# Patient Record
Sex: Female | Born: 1985 | Hispanic: No | Marital: Single | Smoking: Never smoker
Health system: Southern US, Community
[De-identification: ages and names within clinical notes are randomized; demographics above are authoritative.]

## PROBLEM LIST (undated history)

## (undated) DIAGNOSIS — D239 Other benign neoplasm of skin, unspecified: Secondary | ICD-10-CM

## (undated) HISTORY — DX: Other benign neoplasm of skin, unspecified: D23.9

---

## 2018-05-14 ENCOUNTER — Emergency Department
Admission: EM | Admit: 2018-05-14 | Discharge: 2018-05-14 | Disposition: A | Discharge status: Home / Self Care | Payer: Self-pay | Attending: Emergency Medicine | Admitting: Emergency Medicine

## 2018-05-14 ENCOUNTER — Encounter: Payer: Self-pay | Admitting: Emergency Medicine

## 2018-05-14 ENCOUNTER — Emergency Department: Payer: Self-pay

## 2018-05-14 ENCOUNTER — Other Ambulatory Visit: Payer: Self-pay

## 2018-05-14 DIAGNOSIS — Z79899 Other long term (current) drug therapy: Secondary | ICD-10-CM | POA: Insufficient documentation

## 2018-05-14 DIAGNOSIS — R0789 Other chest pain: Secondary | ICD-10-CM | POA: Insufficient documentation

## 2018-05-14 LAB — COMPREHENSIVE METABOLIC PANEL
ALK PHOS: 67 U/L (ref 38–126)
ALT: 16 U/L (ref 0–44)
ANION GAP: 8 (ref 5–15)
AST: 20 U/L (ref 15–41)
Albumin: 4 g/dL (ref 3.5–5.0)
BILIRUBIN TOTAL: 0.3 mg/dL (ref 0.3–1.2)
BUN: 13 mg/dL (ref 6–20)
CALCIUM: 9.2 mg/dL (ref 8.9–10.3)
CO2: 25 mmol/L (ref 22–32)
Chloride: 106 mmol/L (ref 98–111)
Creatinine, Ser: 0.61 mg/dL (ref 0.44–1.00)
GFR calc Af Amer: >60 mL/min (ref >60)
GFR calc non Af Amer: >60 mL/min (ref >60)
Glucose, Bld: 119 mg/dL — ABNORMAL HIGH (ref 70–99)
Potassium: 4.6 mmol/L (ref 3.5–5.1)
SODIUM: 139 mmol/L (ref 135–145)
Total Protein: 8 g/dL (ref 6.5–8.1)

## 2018-05-14 LAB — CBC
HCT: 37 % (ref 35.0–47.0)
Hemoglobin: 12.8 g/dL (ref 12.0–16.0)
MCH: 29.9 pg (ref 26.0–34.0)
MCHC: 34.5 g/dL (ref 32.0–36.0)
MCV: 86.5 fL (ref 80.0–100.0)
PLATELETS: 332 10*3/uL (ref 150–440)
RBC: 4.28 MIL/uL (ref 3.80–5.20)
RDW: 13.2 % (ref 11.5–14.5)
WBC: 6.3 10*3/uL (ref 3.6–11.0)

## 2018-05-14 LAB — TROPONIN I: Troponin I: <0.03 ng/mL (ref <0.03)

## 2018-05-14 LAB — FIBRIN DERIVATIVES D-DIMER (ARMC ONLY): Fibrin derivatives D-dimer (ARMC): 128.38 ng/mL (FEU) (ref 0.00–499.00)

## 2018-05-14 MED ORDER — NAPROXEN 500 MG PO TABS: 500.0000 mg | ORAL_TABLET | Freq: Two times a day (BID) | ORAL | 2 refills | Status: DC

## 2018-05-14 NOTE — ED Notes (Signed)
First Nurse Note: Patient complaining of chest pain radiating to her back on inspiration.  EKG done and patient complaint reviewed with Dr.Kinner.

## 2018-05-14 NOTE — ED Provider Notes (Signed)
[facility] Emergency Department Provider Note   ____________________________________________    I have reviewed the triage vital signs and the nursing notes.   HISTORY  Chief Complaint Chest Pain and Back Pain     HPI Carol Juarez is a 32 y.o. female resents with chest pain.  Patient reports when she takes deep breath she feels pain along the right lateral border of her sternum.  Symptoms started yesterday.  She does not take anything for this.  Denies significant shortness of breath.  No fevers or chills or cough.  No recent travel.  No calf pain or swelling.  No history of blood clots.  Does report recent URI  History reviewed. No pertinent past medical history.  There are no active problems to display for this patient.   History reviewed. No pertinent surgical history.  Prior to Admission medications   Medication Sig Start Date End Date Taking? Authorizing Provider  Omega-3 Fatty Acids (FISH OIL) 1000 MG CAPS Take 1,000 mg by mouth daily.   Yes [provider]  naproxen (NAPROSYN) 500 MG tablet Take 1 tablet (500 mg total) by mouth 2 (two) times daily with a meal. 05/14/18   Lavonia Drafts, MD     Allergies Patient has no known allergies.  No family history on file.  Social History Social History   Tobacco Use  . Smoking status: Not on file  Substance Use Topics  . Alcohol use: Not on file  . Drug use: Not on file    Review of Systems  Constitutional: No fever/chills Eyes: No visual changes.  ENT: No sore throat. Cardiovascular: As above Respiratory: As above Gastrointestinal: No abdominal pain.  No nausea, no vomiting.   Genitourinary: Negative for dysuria. Musculoskeletal: Negative for back pain. Skin: Negative for rash. Neurological: Negative for headaches or weakness   ____________________________________________   PHYSICAL EXAM:  VITAL SIGNS: ED Triage Vitals  Enc Vitals Group     BP 05/14/18 0834  136/86     Pulse Rate 05/14/18 0834 62     Resp 05/14/18 0834 20     Temp 05/14/18 0834 98.2 F (36.8 C)     Temp Source 05/14/18 0834 Oral     SpO2 05/14/18 0834 99 %     Weight 05/14/18 0835 84.4 kg (186 lb)     Height 05/14/18 0835 1.651 m (5\' 5" )     Head Circumference --      Peak Flow --      Pain Score 05/14/18 0835 5     Pain Loc --      Pain Edu? --      Excl. in [place]? --     Constitutional: Alert and oriented. No acute distress.   Nose: No congestion/rhinnorhea. Mouth/Throat: Mucous membranes are moist.   Neck:  Painless ROM Cardiovascular: Normal rate, regular rhythm. Grossly normal heart sounds.  Good peripheral circulation.  Chest wall: Tenderness palpation over the right lateral sternal border Respiratory: Normal respiratory effort.  No retractions. Lungs CTAB. Gastrointestinal: Soft and nontender. No distention.    Musculoskeletal: No lower extremity tenderness nor edema.  Warm and well perfused Neurologic:  Normal speech and language. No gross focal neurologic deficits are appreciated.  Skin:  Skin is warm, dry and intact. No rash noted. Psychiatric: Mood and affect are normal. Speech and behavior are normal.  ____________________________________________   LABS (all labs ordered are listed, but only abnormal results are displayed)  Labs Reviewed  COMPREHENSIVE METABOLIC PANEL - Abnormal;  Notable for the following components:      Result Value   Glucose, Bld 119 (*)    All other components within normal limits  FIBRIN DERIVATIVES D-DIMER ([facility] ONLY)  TROPONIN I  CBC   ____________________________________________  EKG  ED ECG REPORT I, Lavonia Drafts, the attending physician, personally viewed and interpreted this ECG.  Date: 05/14/2018  Rhythm: normal sinus rhythm QRS Axis: normal Intervals: normal ST/T Wave abnormalities: normal Narrative Interpretation: no evidence of acute  ischemia  ____________________________________________  RADIOLOGY  Chest x-ray normal ____________________________________________   PROCEDURES  Procedure(s) performed: No  Procedures   Critical Care performed: No ____________________________________________   INITIAL IMPRESSION / ASSESSMENT AND PLAN / ED COURSE  Pertinent labs & imaging results that were available during my care of the patient were reviewed by me and considered in my medical decision making (see chart for details).  Patient well-appearing in no acute distress.  Low risk for PE, d-dimer is negative as well.  Labs unremarkable, EKG normal, not consistent with ACS.  Suspect costochondritis given tenderness over the right lateral sternal border, will treat with NSAIDs, outpatient follow-up with PCP.  Return precautions discussed    ____________________________________________   FINAL CLINICAL IMPRESSION(S) / ED DIAGNOSES  Final diagnoses:  Chest wall pain        Note:  This document was prepared using Dragon voice recognition software and may include unintentional dictation errors.    Lavonia Drafts, MD 05/14/18 [phone]

## 2018-05-14 NOTE — ED Triage Notes (Signed)
Pt reports yesterday started with a stabbing chest pain to the center of her chest that radiates through to her back. Pt reports when she inhales she feels like she can't get a good breath and the pain comes and.

## 2018-05-14 NOTE — ED Notes (Signed)
Patient returns from Radiology

## 2019-03-28 ENCOUNTER — Other Ambulatory Visit: Payer: Self-pay

## 2019-03-28 DIAGNOSIS — Z20822 Contact with and (suspected) exposure to covid-19: Secondary | ICD-10-CM

## 2019-03-29 LAB — NOVEL CORONAVIRUS, NAA: SARS-CoV-2, NAA: NOT DETECTED

## 2019-06-22 ENCOUNTER — Encounter (HOSPITAL_COMMUNITY): Payer: Self-pay | Admitting: Emergency Medicine

## 2019-06-22 ENCOUNTER — Other Ambulatory Visit: Payer: Self-pay

## 2019-06-22 ENCOUNTER — Emergency Department (HOSPITAL_COMMUNITY): Payer: Medicaid Other

## 2019-06-22 ENCOUNTER — Emergency Department (HOSPITAL_COMMUNITY)
Admission: EM | Admit: 2019-06-22 | Discharge: 2019-06-22 | Disposition: A | Discharge status: Home / Self Care | Payer: Medicaid Other | Attending: Emergency Medicine | Admitting: Emergency Medicine

## 2019-06-22 DIAGNOSIS — Y9389 Activity, other specified: Secondary | ICD-10-CM | POA: Insufficient documentation

## 2019-06-22 DIAGNOSIS — Y998 Other external cause status: Secondary | ICD-10-CM | POA: Insufficient documentation

## 2019-06-22 DIAGNOSIS — S46911A Strain of unspecified muscle, fascia and tendon at shoulder and upper arm level, right arm, initial encounter: Secondary | ICD-10-CM | POA: Diagnosis not present

## 2019-06-22 DIAGNOSIS — Y92410 Unspecified street and highway as the place of occurrence of the external cause: Secondary | ICD-10-CM | POA: Diagnosis not present

## 2019-06-22 DIAGNOSIS — Z79899 Other long term (current) drug therapy: Secondary | ICD-10-CM | POA: Diagnosis not present

## 2019-06-22 DIAGNOSIS — S4991XA Unspecified injury of right shoulder and upper arm, initial encounter: Secondary | ICD-10-CM | POA: Diagnosis present

## 2019-06-22 MED ORDER — CYCLOBENZAPRINE HCL 10 MG PO TABS: 10.0000 mg | ORAL_TABLET | Freq: Two times a day (BID) | ORAL | 0 refills | Status: DC | PRN
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
## 2019-06-22 NOTE — Discharge Instructions (Addendum)
Please read instructions below. Your xray is normal. Apply ice to your areas of pain for 20 minutes at a time. You can take 600 mg of Advil/ibuprofen every 6 hours as needed for pain. You can take flexeril every 12 hours as needed for muscle spasm. Be aware this medication can make you drowsy. Schedule an appointment with your primary care provider to follow up on your visit today. Return to the ER for severely worsening headache, vision changes, if new numbness or tingling in your arms or legs, inability to urinate, inability to hold your bowels, or weakness in your extremities.
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
## 2019-07-10 ENCOUNTER — Emergency Department
Admission: EM | Admit: 2019-07-10 | Discharge: 2019-07-10 | Disposition: A | Discharge status: Home / Self Care | Payer: Medicaid Other | Attending: Emergency Medicine | Admitting: Emergency Medicine

## 2019-07-10 ENCOUNTER — Other Ambulatory Visit: Payer: Self-pay

## 2019-07-10 ENCOUNTER — Emergency Department: Payer: Medicaid Other

## 2019-07-10 ENCOUNTER — Encounter: Payer: Self-pay | Admitting: Emergency Medicine

## 2019-07-10 DIAGNOSIS — Y92009 Unspecified place in unspecified non-institutional (private) residence as the place of occurrence of the external cause: Secondary | ICD-10-CM | POA: Diagnosis not present

## 2019-07-10 DIAGNOSIS — Y939 Activity, unspecified: Secondary | ICD-10-CM | POA: Diagnosis not present

## 2019-07-10 DIAGNOSIS — S52501A Unspecified fracture of the lower end of right radius, initial encounter for closed fracture: Secondary | ICD-10-CM | POA: Diagnosis not present

## 2019-07-10 DIAGNOSIS — Z79899 Other long term (current) drug therapy: Secondary | ICD-10-CM | POA: Diagnosis not present

## 2019-07-10 DIAGNOSIS — Y999 Unspecified external cause status: Secondary | ICD-10-CM | POA: Diagnosis not present

## 2019-07-10 DIAGNOSIS — W010XXA Fall on same level from slipping, tripping and stumbling without subsequent striking against object, initial encounter: Secondary | ICD-10-CM | POA: Insufficient documentation

## 2019-07-10 DIAGNOSIS — S6991XA Unspecified injury of right wrist, hand and finger(s), initial encounter: Secondary | ICD-10-CM | POA: Diagnosis present

## 2019-07-10 MED ORDER — OXYCODONE-ACETAMINOPHEN 5-325 MG PO TABS
1.0000 | ORAL_TABLET | Freq: Once | ORAL | Status: AC
  Administered 2019-07-10: 1 via ORAL
  Filled 2019-07-10: qty 1

## 2019-07-10 MED ORDER — OXYCODONE-ACETAMINOPHEN 5-325 MG PO TABS: 1.0000 | ORAL_TABLET | ORAL | 0 refills | Status: DC | PRN

## 2019-07-10 NOTE — ED Triage Notes (Signed)
Pt to ED from home c/o right wrist pain tonight after a fall.  States pain to lateral side and pointer finger.  Skin WNL and palpable radial pulse.
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
## 2019-08-10 ENCOUNTER — Encounter: Payer: Medicaid Other | Admitting: Obstetrics and Gynecology

## 2019-08-18 ENCOUNTER — Encounter: Payer: Medicaid Other | Admitting: Obstetrics and Gynecology

## 2019-09-01 ENCOUNTER — Encounter: Payer: Medicaid Other | Admitting: Obstetrics and Gynecology

## 2019-12-08 ENCOUNTER — Ambulatory Visit: Payer: Medicaid Other | Attending: Internal Medicine

## 2019-12-08 DIAGNOSIS — Z20822 Contact with and (suspected) exposure to covid-19: Secondary | ICD-10-CM

## 2019-12-09 ENCOUNTER — Telehealth: Payer: Self-pay | Admitting: *Deleted

## 2019-12-09 LAB — SARS-COV-2, NAA 2 DAY TAT

## 2019-12-09 LAB — NOVEL CORONAVIRUS, NAA: SARS-CoV-2, NAA: NOT DETECTED

## 2019-12-09 NOTE — Telephone Encounter (Signed)
Patient called given negative covid results . 

## 2020-02-12 ENCOUNTER — Emergency Department
Admission: EM | Admit: 2020-02-12 | Discharge: 2020-02-12 | Disposition: A | Discharge status: Home / Self Care | Payer: Medicaid Other | Attending: Emergency Medicine | Admitting: Emergency Medicine

## 2020-02-12 ENCOUNTER — Other Ambulatory Visit: Payer: Self-pay

## 2020-02-12 DIAGNOSIS — L989 Disorder of the skin and subcutaneous tissue, unspecified: Secondary | ICD-10-CM | POA: Diagnosis not present

## 2020-02-12 DIAGNOSIS — L0291 Cutaneous abscess, unspecified: Secondary | ICD-10-CM | POA: Diagnosis present

## 2020-02-12 MED ORDER — LIDOCAINE 2 % EX GEL: 1.0000 "application " | Freq: Three times a day (TID) | CUTANEOUS | 1 refills | Status: DC | PRN

## 2020-02-12 NOTE — ED Triage Notes (Signed)
Pt arrived via POV with c/o bump to head, that appears to be a skin tag or keloid. Pt unsure what it is but has been painful for the past several days. States it started out small but has increased in size over the past few days.

## 2020-02-12 NOTE — ED Provider Notes (Signed)
[facility] Emergency Department Provider Note  ____________________________________________  Time seen: Approximately 8:44 PM  I have reviewed the triage vital signs and the nursing notes.   HISTORY  Chief Complaint Abscess    HPI Carol Juarez is a 34 y.o. female who presents the emergency department with a growing lesion to the left scalp.  Patient states that she had a skin tag-like lesion to the left scalp that has been there for years.  Patient states that it has rapidly enlarged over the past few weeks and has become very painful.  Patient denies any other symptoms or complaints.  No medications for his complaint prior to arrival         No past medical history on file.  There are no problems to display for this patient.   Past Surgical History:  Procedure Laterality Date  . CESAREAN SECTION      Prior to Admission medications   Medication Sig Start Date End Date Taking? Authorizing Provider  cyclobenzaprine (FLEXERIL) 10 MG tablet Take 1 tablet (10 mg total) by mouth 2 (two) times daily as needed for muscle spasms. 06/22/19   Robinson, [place] N, PA-C  Lidocaine 2 % GEL Apply 1 application topically 3 (three) times daily as needed. 02/12/20   Esthela Brandner, Charline Bills, PA-C  naproxen (NAPROSYN) 500 MG tablet Take 1 tablet (500 mg total) by mouth 2 (two) times daily with a meal. 05/14/18   Lavonia Drafts, MD  Omega-3 Fatty Acids (FISH OIL) 1000 MG CAPS Take 1,000 mg by mouth daily.    [provider]  oxyCODONE-acetaminophen (PERCOCET) 5-325 MG tablet Take 1 tablet by mouth every 4 (four) hours as needed for severe pain. 07/10/19 07/09/20  Nance Pear, MD    Allergies Patient has no known allergies.  No family history on file.  Social History Social History   Tobacco Use  . Smoking status: Never Smoker  . Smokeless tobacco: Never Used  Substance Use Topics  . Alcohol use: Never  . Drug use: Never     Review of Systems   Constitutional: No fever/chills Eyes: No visual changes. No discharge ENT: No upper respiratory complaints. Cardiovascular: no chest pain. Respiratory: no cough. No SOB. Gastrointestinal: No abdominal pain.  No nausea, no vomiting.  No diarrhea.  No constipation. Musculoskeletal: Negative for musculoskeletal pain. Skin: Growing skin lesion to the left scalp Neurological: Negative for headaches, focal weakness or numbness. 10-point ROS otherwise negative.  ____________________________________________   PHYSICAL EXAM:  VITAL SIGNS: ED Triage Vitals  Enc Vitals Group     BP 02/12/20 1952 (!) 141/80     Pulse Rate 02/12/20 1952 64     Resp 02/12/20 1952 18     Temp 02/12/20 1952 98.8 F (37.1 C)     Temp Source 02/12/20 1952 Oral     SpO2 02/12/20 1952 97 %     Weight 02/12/20 1951 184 lb (83.5 kg)     Height 02/12/20 1951 5\' 5"  (1.651 m)     Head Circumference --      Peak Flow --      Pain Score 02/12/20 1951 5     Pain Loc --      Pain Edu? --      Excl. in [place]? --      Constitutional: Alert and oriented. Well appearing and in no acute distress. Eyes: Conjunctivae are normal. PERRL. EOMI. Head: Atraumatic.  Patient with an enlarged skin lesion to the left scalp.  This is raised,  pedunculated.  No gross erythema.  Area is exquisitely tender to palpation.  There does appear to be vasculature involved in the lesion though this does not appear to be hemangioma.  No fluctuance.  Area is very firm and tender.  No surrounding skin changes. ENT:      Ears:       Nose: No congestion/rhinnorhea.      Mouth/Throat: Mucous membranes are moist.  Neck: No stridor.   Hematological/Lymphatic/Immunilogical: No cervical lymphadenopathy. Cardiovascular: Normal rate, regular rhythm. Normal S1 and S2.  Good peripheral circulation. Respiratory: Normal respiratory effort without tachypnea or retractions. Lungs CTAB. Good air entry to the bases with no decreased or absent breath  sounds. Musculoskeletal: Full range of motion to all extremities. No gross deformities appreciated. Neurologic:  Normal speech and language. No gross focal neurologic deficits are appreciated.  Skin:  Skin is warm, dry and intact. No rash noted.  See above head note Psychiatric: Mood and affect are normal. Speech and behavior are normal. Patient exhibits appropriate insight and judgement.   ____________________________________________   LABS (all labs ordered are listed, but only abnormal results are displayed)  Labs Reviewed - No data to display ____________________________________________  EKG   ____________________________________________  RADIOLOGY   No results found.  ____________________________________________    PROCEDURES  Procedure(s) performed:    Procedures    Medications - No data to display   ____________________________________________   INITIAL IMPRESSION / ASSESSMENT AND PLAN / ED COURSE  Pertinent labs & imaging results that were available during my care of the patient were reviewed by me and considered in my medical decision making (see chart for details).  Review of the  [place]RS was performed in accordance of the [place] prior to dispensing any controlled drugs.           Patient's diagnosis is consistent with skin lesion to the scalp.  Patient presented to the emergency department with a rapidly enlarging lesion to the left scalp.  She states that she had a skin tag to the left scalp that has rapidly enlarged over the past several weeks.  It is very painful for the patient.  Area was very tender to palpation.  There does appear to be vasculature involved in this lesion however does not appear to be a hemangioma.  Differential included skin cancer, hemangioma, nevus, acrochordon.  As I am unsure the exact etiology, no attempts at removal was performed.  I recommended that the patient follow-up with dermatology for further evaluation and  management.  Topical lidocaine provided for symptom relief until patient can see dermatology. Patient is given ED precautions to return to the ED for any worsening or new symptoms.     ____________________________________________  FINAL CLINICAL IMPRESSION(S) / ED DIAGNOSES  Final diagnoses:  Skin lesion of scalp      NEW MEDICATIONS STARTED DURING THIS VISIT:  ED Discharge Orders         Ordered    Lidocaine 2 % GEL  3 times daily PRN     Discontinue  Reprint     02/12/20 2144              This chart was dictated using voice recognition software/Dragon. Despite best efforts to proofread, errors can occur which can change the meaning. Any change was purely unintentional.    Darletta Moll, PA-C 02/13/20 0013    Blake Divine, MD 02/13/20 [phone]

## 2020-06-07 ENCOUNTER — Emergency Department
Admission: EM | Admit: 2020-06-07 | Discharge: 2020-06-07 | Disposition: A | Discharge status: Home / Self Care | Payer: Medicaid Other | Attending: Emergency Medicine | Admitting: Emergency Medicine

## 2020-06-07 ENCOUNTER — Other Ambulatory Visit: Payer: Self-pay

## 2020-06-07 ENCOUNTER — Emergency Department: Payer: Medicaid Other

## 2020-06-07 DIAGNOSIS — R509 Fever, unspecified: Secondary | ICD-10-CM | POA: Diagnosis present

## 2020-06-07 DIAGNOSIS — U071 COVID-19: Secondary | ICD-10-CM | POA: Diagnosis not present

## 2020-06-07 LAB — RESPIRATORY PANEL BY RT PCR (FLU A&B, COVID)
Influenza A by PCR: NEGATIVE
Influenza B by PCR: NEGATIVE
SARS Coronavirus 2 by RT PCR: POSITIVE — AB

## 2020-06-07 MED ORDER — BENZONATATE 100 MG PO CAPS: ORAL_CAPSULE | ORAL | 0 refills | Status: DC

## 2020-06-07 MED ORDER — ACETAMINOPHEN 325 MG PO TABS
650.0000 mg | ORAL_TABLET | Freq: Once | ORAL | Status: AC
  Administered 2020-06-07: 650 mg via ORAL
  Filled 2020-06-07: qty 2

## 2020-06-07 MED ORDER — PREDNISONE 20 MG PO TABS: ORAL_TABLET | ORAL | 0 refills | Status: DC

## 2020-06-07 MED ORDER — METOCLOPRAMIDE HCL 10 MG PO TABS
10.0000 mg | ORAL_TABLET | Freq: Once | ORAL | Status: AC
  Administered 2020-06-07: 10 mg via ORAL
  Filled 2020-06-07: qty 1

## 2020-06-07 MED ORDER — ALBUTEROL SULFATE HFA 108 (90 BASE) MCG/ACT IN AERS: 2.0000 | INHALATION_SPRAY | Freq: Four times a day (QID) | RESPIRATORY_TRACT | 0 refills | Status: DC | PRN

## 2020-06-07 MED ORDER — ONDANSETRON 4 MG PO TBDP: 4.0000 mg | ORAL_TABLET | Freq: Three times a day (TID) | ORAL | 0 refills | Status: DC | PRN

## 2020-06-07 MED ORDER — IBUPROFEN 800 MG PO TABS
800.0000 mg | ORAL_TABLET | Freq: Once | ORAL | Status: AC
  Administered 2020-06-07: 800 mg via ORAL
  Filled 2020-06-07: qty 1

## 2020-06-07 NOTE — ED Provider Notes (Signed)
[facility] Emergency Department Provider Note ____________________________________________  Time seen: 2051  I have reviewed the triage vital signs and the nursing notes.  HISTORY  Chief Complaint  Fever, Generalized Body Aches, and Sore Throat   HPI Carol Juarez is a 34 y.o. female presents himself to the ED for evaluation of symptoms including  sore throat, nasal congestion, cough, fever, chills, body aches.  Patient describes her son was exposed to Covid over a week ago but they both tested negative for Covid last week.  She reports onset of her symptoms a few days earlier.  She still reports the ability to taste and smell food as expected.  She has not recently been vaccinated for Covid.  She denies any other significant medical history.  No past medical history on file.  There are no problems to display for this patient.   Past Surgical History:  Procedure Laterality Date  . CESAREAN SECTION      Prior to Admission medications   Medication Sig Start Date End Date Taking? Authorizing Provider  albuterol (VENTOLIN HFA) 108 (90 Base) MCG/ACT inhaler Inhale 2 puffs into the lungs every 6 (six) hours as needed for shortness of breath. 06/07/20   Delila Kuklinski, Dannielle Karvonen, PA-C  benzonatate (TESSALON PERLES) 100 MG capsule Take 1-2 tabs TID prn cough 06/07/20   Lida Berkery, Dannielle Karvonen, PA-C  Omega-3 Fatty Acids (FISH OIL) 1000 MG CAPS Take 1,000 mg by mouth daily.    [provider]  ondansetron (ZOFRAN ODT) 4 MG disintegrating tablet Take 1 tablet (4 mg total) by mouth every 8 (eight) hours as needed. 06/07/20   Marica Trentham, Dannielle Karvonen, PA-C  predniSONE (DELTASONE) 20 MG tablet Take 3 tabs daily x 3 days; Take 2 tabs daily x 3 days; Take 1 tab daily x 3 days; Take 0.5 tabs daily x 4 days 06/07/20   Radek Carnero, Dannielle Karvonen, PA-C    Allergies Patient has no known allergies.  No family history on file.  Social History Social History   Tobacco  Use  . Smoking status: Never Smoker  . Smokeless tobacco: Never Used  Substance Use Topics  . Alcohol use: Never  . Drug use: Never    Review of Systems  Constitutional: Positive for fever, body aches, and malaise.. Eyes: Negative for visual changes. ENT: Positive for sore throat. Cardiovascular: Negative for chest pain. Respiratory: Positive for shortness of breath and cough as above. Gastrointestinal: Negative for abdominal pain, vomiting and diarrhea. Genitourinary: Negative for dysuria. Musculoskeletal: Negative for back pain. Skin: Negative for rash. Neurological: Negative for headaches, focal weakness or numbness. ____________________________________________  PHYSICAL EXAM:  VITAL SIGNS: ED Triage Vitals  Enc Vitals Group     BP 06/07/20 1946 (!) 155/99     Pulse Rate 06/07/20 1946 91     Resp 06/07/20 1946 18     Temp 06/07/20 1946 (!) 101.4 F (38.6 C)     Temp Source 06/07/20 1946 Oral     SpO2 06/07/20 1946 100 %     Weight 06/07/20 1947 178 lb (80.7 kg)     Height 06/07/20 1947 5\' 5"  (1.651 m)     Head Circumference --      Peak Flow --      Pain Score 06/07/20 1947 7     Pain Loc --      Pain Edu? --      Excl. in [place]? --     Constitutional: Alert and oriented. Well appearing and  in no distress. Head: Normocephalic and atraumatic. Eyes: Conjunctivae are normal. PERRL. Normal extraocular movements Ears: Canals clear. TMs intact bilaterally. Nose: No congestion/rhinorrhea/epistaxis. Mouth/Throat: Mucous membranes are moist.  Uvula is midline and tonsils are flat.  No oropharyngeal erythema is appreciated. Cardiovascular: Normal rate, regular rhythm. Normal distal pulses. Respiratory: Normal respiratory effort. No wheezes/rales/rhonchi. Gastrointestinal: Soft and nontender. No distention. Musculoskeletal: Nontender with normal range of motion in all extremities.  Neurologic:  Normal gait without ataxia. Normal speech and language. No gross focal  neurologic deficits are appreciated. Skin:  Skin is warm, dry and intact. No rash noted. Psychiatric: Mood and affect are normal. Patient exhibits appropriate insight and judgment. ____________________________________________   LABS (pertinent positives/negatives) Labs Reviewed  RESPIRATORY PANEL BY RT PCR (FLU A&B, COVID) - Abnormal; Notable for the following components:      Result Value   SARS Coronavirus 2 by RT PCR POSITIVE (*)    All other components within normal limits  ____________________________________________   RADIOLOGY  CXR  Negative  I, Garima Chronis V Bacon-Thelton Graca, personally viewed and evaluated these images (plain radiographs) as part of my medical decision making, as well as reviewing the written report by the radiologist. ____________________________________________  PROCEDURES  Reglan 10 mg PO IBU 800 mg PO Tylenol 650 mg PO  Procedures ____________________________________________  INITIAL IMPRESSION / ASSESSMENT AND PLAN / ED COURSE  DDX: CAP, AOM, sinusitis, COVID, influenza  Patient with ED evaluation of symptoms concerning for possible Covid exposure.  She presented with fever, malaise, cough, congestion.  She was without signs of acute respiratory distress on exam, no indication of dehydration and no signs of sepsis.  Patient was febrile on presentation, but responded to antipyretics given in the ED.  She will be discharged at this time with prescriptions for albuterol, Zofran, Tessalon Perles, and prednisone taper.  She will follow with primary provider return to the ED for acutely worsening symptoms.  Rigby Swamy was evaluated in Emergency Department on 06/07/2020 for the symptoms described in the history of present illness. She was evaluated in the context of the global COVID-19 pandemic, which necessitated consideration that the patient might be at risk for infection with the SARS-CoV-2 virus that causes COVID-19. Institutional protocols and  algorithms that pertain to the evaluation of patients at risk for COVID-19 are in a state of rapid change based on information released by regulatory bodies including the CDC and federal and state organizations. These policies and algorithms were followed during the patient's care in the ED. ____________________________________________  FINAL CLINICAL IMPRESSION(S) / ED DIAGNOSES  Final diagnoses:  COVID-19      Melvenia Needles, PA-C 06/07/20 2153    Lucrezia Starch, MD 06/08/20 [phone]

## 2020-06-07 NOTE — ED Notes (Signed)
Patient declined discharge vital signs. 

## 2020-06-07 NOTE — ED Triage Notes (Signed)
Pt in with co sore throat, nasal congestion, cough, fever, chills and body aches for a few days. States son was exposed to covid over a week ago but tested negative for covid last Wednesday.

## 2020-06-07 NOTE — Discharge Instructions (Addendum)
Your COVID test is positive. Your chest XR does not show any pneumonia. Continue to monitor and treat any fevers with Tylenol. Take the prescription meds as directed. Follow-up with your provider for continued symptoms. Return if needed.

## 2020-10-23 ENCOUNTER — Inpatient Hospital Stay: Payer: Medicaid Other | Admitting: Licensed Clinical Social Worker

## 2020-10-23 ENCOUNTER — Inpatient Hospital Stay: Payer: Medicaid Other

## 2020-11-15 ENCOUNTER — Other Ambulatory Visit: Payer: Self-pay

## 2020-11-15 ENCOUNTER — Inpatient Hospital Stay: Payer: Medicaid Other

## 2020-11-15 ENCOUNTER — Encounter: Payer: Self-pay | Admitting: Licensed Clinical Social Worker

## 2020-11-15 ENCOUNTER — Inpatient Hospital Stay: Payer: Medicaid Other | Attending: Oncology | Admitting: Licensed Clinical Social Worker

## 2020-11-15 DIAGNOSIS — D239 Other benign neoplasm of skin, unspecified: Secondary | ICD-10-CM

## 2020-11-15 NOTE — Progress Notes (Signed)
REFERRING PROVIDER: Dermatology, [address],  [place] [address]  PRIMARY PROVIDER:  [place]  PRIMARY REASON FOR VISIT:  1. Sebaceoma      HISTORY OF PRESENT ILLNESS:   Ms. Lynam, a 35 y.o. 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  Ms. Helwig presents to clinic today to discuss the possibility of a hereditary predisposition to cancer, genetic testing, and to further clarify her future cancer risks, as well as potential cancer risks for family members.   CANCER HISTORY:  Oncology History   No history exists.     RISK FACTORS:  Menarche was at age 84.  First live birth at age 66.  OCP use for approximately 0 years.  Ovaries intact: yes.  Hysterectomy: no.  Menopausal status: premenopausal.  HRT use: 0 years. Colonoscopy: no; not examined. Mammogram within the last year: no. Number of breast biopsies: 0. Up to date with pelvic exams: yes.   Past Medical History:  Diagnosis Date  . Sebaceoma     Past Surgical History:  Procedure Laterality Date  . CESAREAN SECTION      Social History   Socioeconomic History  . Marital status: Single    Spouse name: Not on file  . Number of children: Not on file  . Years of education: Not on file  . Highest education level: Not on file  Occupational History  . Not on file  Tobacco Use  . Smoking status: Never Smoker  . Smokeless tobacco: Never Used  Substance and Sexual Activity  . Alcohol use: Never  . Drug use: Never  . Sexual activity: Yes    Birth control/protection: None  Other Topics Concern  . Not on file  Social History Narrative  . Not on file   Social Determinants of Health   Financial Resource Strain: Not on file  Food Insecurity: Not on file  Transportation Needs: Not on file  Physical Activity: Not on file  Stress: Not on file  Social  Connections: Not on file     FAMILY HISTORY:  We obtained a detailed, 4-generation family history.  Significant diagnoses are listed below: Family History  Problem Relation Age of Onset  . Other Cousin        unk skin condition; passed from it   Ms. Asleson has no known family history of cancer.  She has 2 sons and 2 brothers. Patient notes her brother also has had something removed from his scalp. Patient's mother is living at 20 and patient has 2 maternal uncles, 1 maternal aunt. Grandmother passed at 77, no info about maternal grandfather.  Ms. Viar father is living in his late 28s and has 1 brother, also in his 49s. Patient's paternal grandmother passed at 66, and her sister's son passed from an unknown skin condition. No information about paternal grandfather.  Ms. Hemmer is unaware of previous family history of genetic testing for hereditary cancer risks. Patient's maternal and paternal relatives are from [place] and [place]. There is no reported Ashkenazi Jewish ancestry. There is no known consanguinity.   GENETIC COUNSELING ASSESSMENT: Ms. Sessa is a 35 y.o. female with a personal history of a sebaceous neoplasm which is somewhat suggestive of a hereditary cancer syndrome, such as Lynch syndrome, and predisposition to cancer. We, therefore, discussed and recommended the following at today's visit.   DISCUSSION:  We discussed that  sebaceous neoplasms are typically sporadic but they can also be associated with a condition called Lynch syndrome.  According to [place], about 2% (64 out of 3299) of patients with sebaceous skin lesions between 2000-2012 had Lynch syndrome (PMID [id number]). This condition increases risk for colon, endometrial, ovarian and other cancers. We discussed that approximately 5-10% of cancer in general is hereditary. We discussed that testing is beneficial for several reasons including knowing about cancer risks, identifying potential screening and  risk-reduction options that may be appropriate, and to understand if other family members could be at risk for cancer and allow them to undergo genetic testing.   We reviewed the characteristics, features and inheritance patterns of hereditary cancer syndromes. We also discussed genetic testing, including the appropriate family members to test, the process of testing, insurance coverage and turn-around-time for results. We discussed the implications of a negative, positive and/or variant of uncertain significant result. We recommended Ms. Dunnigan pursue genetic testing for the [facility] Multi-Cancer +RNA gene panel.   The Multi-Cancer Panel + RNA offered by [facility] includes sequencing and/or deletion duplication testing of the following 84 genes: AIP, ALK, APC, ATM, AXIN2,BAP1,  BARD1, BLM, BMPR1A, BRCA1, BRCA2, BRIP1, CASR, CDC73, CDH1, CDK4, CDKN1B, CDKN1C, CDKN2A (p14ARF), CDKN2A (p16INK4a), CEBPA, CHEK2, CTNNA1, DICER1, DIS3L2, EGFR (c.2369C>T, p.Thr790Met variant only), EPCAM (Deletion/duplication testing only), FH, FLCN, GATA2, GPC3, GREM1 (Promoter region deletion/duplication testing only), HOXB13 (c.251G>A, p.Gly84Glu), HRAS, KIT, MAX, MEN1, MET, MITF (c.952G>A, p.Glu318Lys variant only), MLH1, MSH2, MSH3, MSH6, MUTYH, NBN, NF1, NF2, NTHL1, PALB2, PDGFRA, PHOX2B, PMS2, POLD1, POLE, POT1, PRKAR1A, PTCH1, PTEN, RAD50, RAD51C, RAD51D, RB1, RECQL4, RET, RUNX1, SDHAF2, SDHA (sequence changes only), SDHB, SDHC, SDHD, SMAD4, SMARCA4, SMARCB1, SMARCE1, STK11, SUFU, TERC, TERT, TMEM127, TP53, TSC1, TSC2, VHL, WRN and WT1.  Based on Ms. Mantell's personal history of sebaceous neoplasm and no other family history, she does not meet medical criteria for genetic testing.  She is still interested in testing. She may have an out of pocket cost.   PLAN: After considering the risks, benefits, and limitations, Ms. Coggeshall provided informed consent to pursue genetic testing and the blood sample was sent to [facility] for analysis of the Multi-Cancer Panel +RNA. Results should be available within approximately 2-3 weeks' time, at which point they will be disclosed by telephone to Ms. Evora, as will any additional recommendations warranted by these results. Ms. Mccloud will receive a summary of her genetic counseling visit and a copy of her results once available. This information will also be available in Epic.   Ms. Tino questions were answered to her satisfaction today. Our contact information was provided should additional questions or concerns arise. Thank you for the referral and allowing [place] to share in the care of your patient.   Faith Rogue, MS, [facility] Genetic Counselor [email or web address] Phone: [phone]  The patient was seen for a total of 20 minutes in face-to-face genetic counseling.  Dr. Grayland Ormond was available for discussion regarding this case.   _______________________________________________________________________ For Office Staff:  Number of people involved in session: 1 Was an Intern/ student involved with case: no

## 2020-12-04 ENCOUNTER — Ambulatory Visit: Payer: Self-pay | Admitting: Licensed Clinical Social Worker

## 2020-12-04 ENCOUNTER — Encounter: Payer: Self-pay | Admitting: Licensed Clinical Social Worker

## 2020-12-04 ENCOUNTER — Telehealth: Payer: Self-pay | Admitting: Licensed Clinical Social Worker

## 2020-12-04 DIAGNOSIS — Z7689 Persons encountering health services in other specified circumstances: Secondary | ICD-10-CM | POA: Insufficient documentation

## 2020-12-04 DIAGNOSIS — D239 Other benign neoplasm of skin, unspecified: Secondary | ICD-10-CM

## 2020-12-04 DIAGNOSIS — Z1379 Encounter for other screening for genetic and chromosomal anomalies: Secondary | ICD-10-CM

## 2020-12-04 NOTE — Progress Notes (Signed)
HPI:  Carol Juarez was previously seen in the [place] clinic due to a personal history of a sebaceoma and concerns regarding a hereditary predisposition to cancer. Please refer to our prior cancer genetics clinic note for more information regarding our discussion, assessment and recommendations, at the time. Carol Juarez recent genetic test results were disclosed to her, as were recommendations warranted by these results. These results and recommendations are discussed in more detail below.  CANCER HISTORY:  Oncology History   No history exists.    FAMILY HISTORY:  We obtained a detailed, 4-generation family history.  Significant diagnoses are listed below: Family History  Problem Relation Age of Onset  . Other Cousin        unk skin condition; passed from it    Carol Juarez has no known family history of cancer.  She has 2 sons and 2 brothers. Patient notes her brother also has had something removed from his scalp. Patient's mother is living at 22 and patient has 2 maternal uncles, 1 maternal aunt. Grandmother passed at 69, no info about maternal grandfather.  Carol Juarez father is living in his late 43s and has 1 brother, also in his 50s. Patient's paternal grandmother passed at 66, and her sister's son passed from an unknown skin condition. No information about paternal grandfather.  Carol Juarez is unaware of previous family history of genetic testing for hereditary cancer risks. Patient's maternal and paternal relatives are from [place] and [place]. There is no reported Ashkenazi Jewish ancestry. There is no known consanguinity.    GENETIC TEST RESULTS: Genetic testing reported out on 12/03/2020 through the [facility] Multi- cancer panel found no pathogenic mutations.   The Multi-Cancer Panel + RNA offered by [facility] includes sequencing and/or deletion duplication testing of the following 84 genes: AIP, ALK, APC, ATM, AXIN2,BAP1,  BARD1, BLM, BMPR1A, BRCA1,  BRCA2, BRIP1, CASR, CDC73, CDH1, CDK4, CDKN1B, CDKN1C, CDKN2A (p14ARF), CDKN2A (p16INK4a), CEBPA, CHEK2, CTNNA1, DICER1, DIS3L2, EGFR (c.2369C>T, p.Thr790Met variant only), EPCAM (Deletion/duplication testing only), FH, FLCN, GATA2, GPC3, GREM1 (Promoter region deletion/duplication testing only), HOXB13 (c.251G>A, p.Gly84Glu), HRAS, KIT, MAX, MEN1, MET, MITF (c.952G>A, p.Glu318Lys variant only), MLH1, MSH2, MSH3, MSH6, MUTYH, NBN, NF1, NF2, NTHL1, PALB2, PDGFRA, PHOX2B, PMS2, POLD1, POLE, POT1, PRKAR1A, PTCH1, PTEN, RAD50, RAD51C, RAD51D, RB1, RECQL4, RET, RUNX1, SDHAF2, SDHA (sequence changes only), SDHB, SDHC, SDHD, SMAD4, SMARCA4, SMARCB1, SMARCE1, STK11, SUFU, TERC, TERT, TMEM127, TP53, TSC1, TSC2, VHL, WRN and WT1.   The test report has been scanned into EPIC and is located under the Molecular Pathology section of the Results Review tab.  A portion of the result report is included below for reference.     We discussed with Carol Juarez that because current genetic testing is not perfect, it is possible there may be a gene mutation in one of these genes that current testing cannot detect, but that chance is small.  We also discussed, that there could be another gene that has not yet been discovered, or that we have not yet tested, that is responsible for the cancer diagnoses in the family. It is also possible there is a hereditary cause for the cancer in the family that Carol Juarez did not inherit and therefore was not identified in her testing.  Therefore, it is important to remain in touch with cancer genetics in the future so that we can continue to offer Carol Juarez the most up to date genetic testing.   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  At this time, it is unknown if these variants are associated with increased cancer risk or if  they are normal findings, but most variants such as these get reclassified to being inconsequential. They should not be used to make medical management decisions. With time, we suspect the lab will determine the significance of these variants, if any. If we do learn more about them, we will try to contact Carol Juarez to discuss it further. However, it is important to stay in touch with [place] periodically and keep the address and phone number up to date.  ADDITIONAL GENETIC TESTING: We discussed with Carol Juarez that her genetic testing was fairly extensive.  If there are genes identified to increase cancer risk that can be analyzed in the future, we would be happy to discuss and coordinate this testing at that time.    CANCER SCREENING RECOMMENDATIONS: Carol Juarez test result is considered negative (normal).  This means that we have not identified a hereditary cause for her personal history of sebaceoma at this time.     While reassuring, this does not definitively rule out a hereditary predisposition to cancer. It is still possible that there could be genetic mutations that are undetectable by current technology. There could be genetic mutations in genes that have not been tested or identified to increase cancer risk.  Therefore, it is recommended she continue to follow the cancer management and screening guidelines provided by her  primary healthcare provider.   An individual's cancer risk and medical management are not determined by genetic test results alone. Overall cancer risk assessment incorporates additional factors, including personal medical history, family history, and any available genetic information that may result in a personalized plan for cancer prevention and surveillance.  RECOMMENDATIONS FOR FAMILY MEMBERS:  Relatives in this family might be at some increased risk of developing cancer, over the general population risk, simply due to the family history of cancer.  We recommended  female relatives in this family have a yearly mammogram beginning at age 51, or 3 years younger than the earliest onset of cancer, an annual clinical breast exam, and perform monthly breast self-exams. Female relatives in this family should also have a gynecological exam as recommended by their primary provider.  All family members should be referred for colonoscopy starting at age 92.   FOLLOW-UP: Lastly, we discussed with Carol Juarez that cancer genetics is a rapidly advancing field and it is possible that new genetic tests will be appropriate for her and/or her family members in the future. We encouraged her to remain in contact with cancer genetics on an annual basis so we can update her personal and family histories and let her know of advances in cancer genetics that may benefit this family.   Our contact number was provided. Carol Juarez questions were answered to her satisfaction, and she knows she is welcome to call us at anytime with additional questions or concerns.   Carol Rogue, MS, [facility] Genetic Counselor [email or web address] Phone: [phone]

## 2020-12-04 NOTE — Telephone Encounter (Signed)
Revealed negative genetic testing.  Revealed that a VUS in BRCA2, KIT, and TERT  were identified.  We discussed that we do not know why she has had a sebaceoma or why there is cancer in the family. It could be due to a different gene that we are not testing, or something our current technology cannot pick up.  It will be important for her to keep in contact with genetics to learn if additional testing may be needed in the future.

## 2021-03-19 IMAGING — DX DG CHEST 1V PORT
1 series · 1 of 1 positions shown · non-contrast
Comparison: 05/14/18

CLINICAL DATA: Cough and fever

EXAM:
PORTABLE CHEST 1 VIEW

[chest ap]
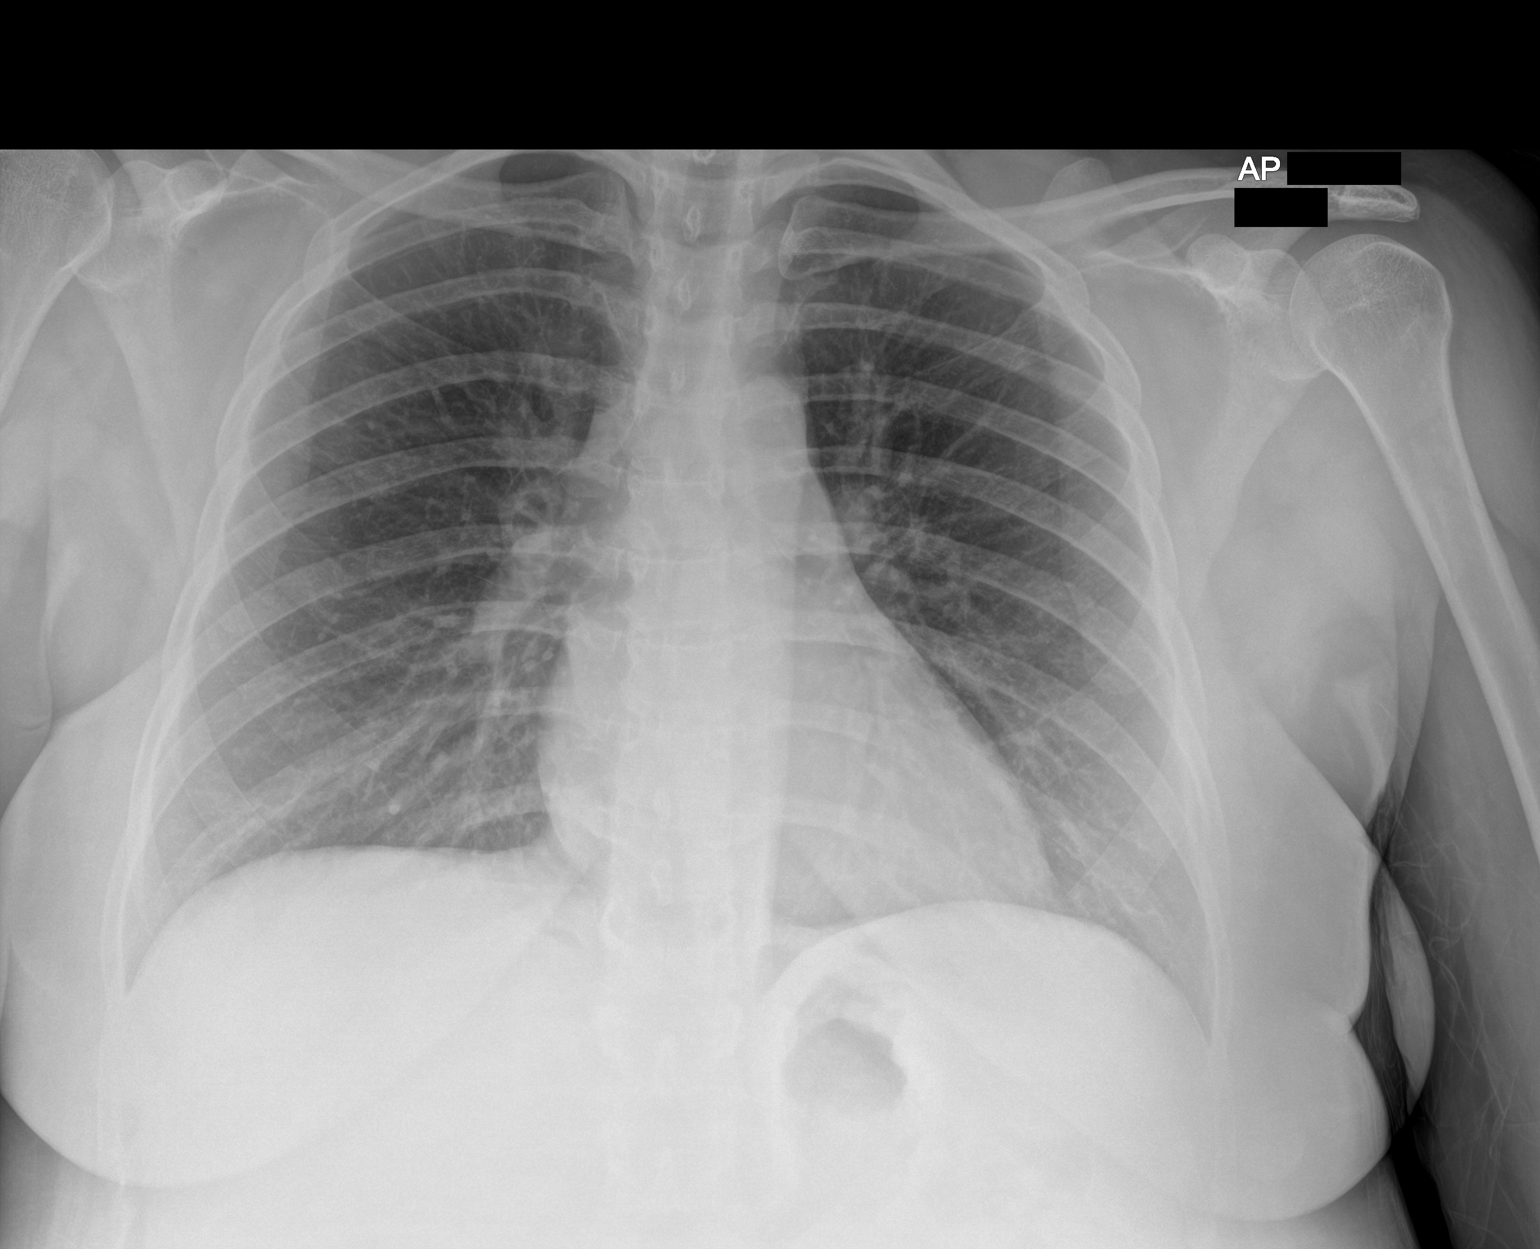

[1 of 1 positions shown; findings below may reference images not displayed]

FINDINGS: The heart size and mediastinal contours are within normal limits.
Both lungs are clear. The visualized skeletal structures are
unremarkable.
IMPRESSION: No active disease.

## 2021-10-23 ENCOUNTER — Ambulatory Visit: Payer: Medicaid Other | Admitting: Family Medicine

## 2022-02-07 ENCOUNTER — Emergency Department: Payer: Medicaid Other

## 2022-02-07 ENCOUNTER — Emergency Department
Admission: EM | Admit: 2022-02-07 | Discharge: 2022-02-07 | Disposition: A | Discharge status: Home / Self Care | Payer: Medicaid Other | Attending: Student in an Organized Health Care Education/Training Program | Admitting: Student in an Organized Health Care Education/Training Program

## 2022-02-07 ENCOUNTER — Other Ambulatory Visit: Payer: Self-pay

## 2022-02-07 ENCOUNTER — Encounter: Payer: Self-pay | Admitting: Emergency Medicine

## 2022-02-07 DIAGNOSIS — X58XXXA Exposure to other specified factors, initial encounter: Secondary | ICD-10-CM | POA: Insufficient documentation

## 2022-02-07 DIAGNOSIS — S62622A Displaced fracture of medial phalanx of right middle finger, initial encounter for closed fracture: Secondary | ICD-10-CM | POA: Diagnosis not present

## 2022-02-07 DIAGNOSIS — S6991XA Unspecified injury of right wrist, hand and finger(s), initial encounter: Secondary | ICD-10-CM | POA: Diagnosis present

## 2022-03-17 ENCOUNTER — Encounter: Payer: Self-pay | Admitting: Emergency Medicine

## 2022-03-17 ENCOUNTER — Emergency Department: Payer: Medicaid Other

## 2022-03-17 DIAGNOSIS — L03115 Cellulitis of right lower limb: Secondary | ICD-10-CM | POA: Insufficient documentation

## 2022-03-17 DIAGNOSIS — R2241 Localized swelling, mass and lump, right lower limb: Secondary | ICD-10-CM | POA: Diagnosis present

## 2022-03-17 DIAGNOSIS — M25511 Pain in right shoulder: Secondary | ICD-10-CM | POA: Diagnosis not present

## 2022-03-17 DIAGNOSIS — R21 Rash and other nonspecific skin eruption: Secondary | ICD-10-CM | POA: Insufficient documentation
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
## 2022-03-18 ENCOUNTER — Emergency Department: Payer: Medicaid Other

## 2022-03-18 ENCOUNTER — Emergency Department
Admission: EM | Admit: 2022-03-18 | Discharge: 2022-03-18 | Disposition: A | Discharge status: Home / Self Care | Payer: Medicaid Other | Attending: Student in an Organized Health Care Education/Training Program | Admitting: Student in an Organized Health Care Education/Training Program

## 2022-03-18 DIAGNOSIS — L03115 Cellulitis of right lower limb: Secondary | ICD-10-CM

## 2022-03-18 MED ORDER — CEPHALEXIN 500 MG PO CAPS
500.0000 mg | ORAL_CAPSULE | Freq: Once | ORAL | Status: AC
  Administered 2022-03-18: 500 mg via ORAL
  Filled 2022-03-18: qty 1

## 2022-03-18 MED ORDER — CEPHALEXIN 500 MG PO CAPS: 500.0000 mg | ORAL_CAPSULE | Freq: Three times a day (TID) | ORAL | 0 refills | Status: AC
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
## 2023-10-01 ENCOUNTER — Encounter: Payer: Self-pay | Admitting: Cardiology

## 2023-10-01 ENCOUNTER — Ambulatory Visit: Payer: Medicaid Other | Admitting: Cardiology

## 2023-10-01 VITALS — BP 130/88 | HR 79 | Ht 65.0 in | Wt 192.0 lb

## 2023-10-01 DIAGNOSIS — Z1329 Encounter for screening for other suspected endocrine disorder: Secondary | ICD-10-CM | POA: Diagnosis not present

## 2023-10-01 DIAGNOSIS — L659 Nonscarring hair loss, unspecified: Secondary | ICD-10-CM

## 2023-10-01 DIAGNOSIS — Z131 Encounter for screening for diabetes mellitus: Secondary | ICD-10-CM

## 2023-10-01 DIAGNOSIS — Z13220 Encounter for screening for lipoid disorders: Secondary | ICD-10-CM

## 2023-10-01 DIAGNOSIS — Z7689 Persons encountering health services in other specified circumstances: Secondary | ICD-10-CM | POA: Diagnosis not present

## 2023-10-01 NOTE — Progress Notes (Signed)
New Patient Office Visit  Subjective   Patient ID: Carol Juarez, female    DOB: March 02, 1986  Age: 38 y.o. MRN: [id number]  CC:  Chief Complaint  Patient presents with   New Patient (Initial Visit)    New Patient    HPI Winter Jocelyn presents to establish care Previous Primary Care provider/office:   she does not have additional concerns to discuss today.   Patient in office to establish care. Patient doing well overall. Complains of hair loss. States she experienced this years ago, resolved on it's own. Will order lab work today. If labs normal, will refer to dermatology.  Patient also complaining of not sleeping well. States she has not trouble falling asleep. However, she wakes up every morning around 3:00 am and is unable to fall back to sleep. Admits to falling asleep with the television on, wakes up with television still on. Recommend turning television off prior to sleep to see if it makes a difference.  Pap smear 2023 normal per patient.     Outpatient Encounter Medications as of 10/01/2023  Medication Sig   [DISCONTINUED] albuterol (VENTOLIN HFA) 108 (90 Base) MCG/ACT inhaler Inhale 2 puffs into the lungs every 6 (six) hours as needed for shortness of breath. (Patient not taking: Reported on 10/01/2023)   [DISCONTINUED] benzonatate (TESSALON PERLES) 100 MG capsule Take 1-2 tabs TID prn cough (Patient not taking: Reported on 10/01/2023)   [DISCONTINUED] Omega-3 Fatty Acids (FISH OIL) 1000 MG CAPS Take 1,000 mg by mouth daily. (Patient not taking: Reported on 10/01/2023)   [DISCONTINUED] ondansetron (ZOFRAN ODT) 4 MG disintegrating tablet Take 1 tablet (4 mg total) by mouth every 8 (eight) hours as needed. (Patient not taking: Reported on 10/01/2023)   [DISCONTINUED] predniSONE (DELTASONE) 20 MG tablet Take 3 tabs daily x 3 days; Take 2 tabs daily x 3 days; Take 1 tab daily x 3 days; Take 0.5 tabs daily x 4 days (Patient not taking: Reported on 10/01/2023)   No  facility-administered encounter medications on file as of 10/01/2023.    Past Medical History:  Diagnosis Date   Sebaceoma     Past Surgical History:  Procedure Laterality Date   CESAREAN SECTION      Family History  Problem Relation Age of Onset   Other Cousin        unk skin condition; passed from it    Social History   Socioeconomic History   Marital status: Single    Spouse name: Not on file   Number of children: Not on file   Years of education: Not on file   Highest education level: Not on file  Occupational History   Not on file  Tobacco Use   Smoking status: Never   Smokeless tobacco: Never  Substance and Sexual Activity   Alcohol use: Never   Drug use: Never   Sexual activity: Yes    Birth control/protection: None  Other Topics Concern   Not on file  Social History Narrative   Not on file   Social Drivers of Health   Financial Resource Strain: Not on file  Food Insecurity: Not on file  Transportation Needs: Not on file  Physical Activity: Not on file  Stress: Not on file  Social Connections: Not on file  Intimate Partner Violence: Not on file    Review of Systems  Constitutional: Negative.   HENT: Negative.    Eyes: Negative.   Respiratory: Negative.  Negative for shortness of breath.   Cardiovascular: Negative.  Negative for chest pain.  Gastrointestinal: Negative.  Negative for abdominal pain, constipation and diarrhea.  Genitourinary: Negative.   Musculoskeletal:  Negative for joint pain and myalgias.  Skin: Negative.   Neurological: Negative.  Negative for dizziness and headaches.  Endo/Heme/Allergies: Negative.   All other systems reviewed and are negative.       Objective   BP 130/88   Pulse 79   Ht 5\' 5"  (1.651 m)   Wt 192 lb (87.1 kg)   SpO2 98%   BMI 31.95 kg/m   Physical Exam Vitals and nursing note reviewed.  Constitutional:      Appearance: Normal appearance. She is normal weight.  HENT:     Head: Normocephalic  and atraumatic.     Nose: Nose normal.     Mouth/Throat:     Mouth: Mucous membranes are moist.  Eyes:     Extraocular Movements: Extraocular movements intact.     Conjunctiva/sclera: Conjunctivae normal.     Pupils: Pupils are equal, round, and reactive to light.  Cardiovascular:     Rate and Rhythm: Normal rate and regular rhythm.     Pulses: Normal pulses.     Heart sounds: Normal heart sounds.  Pulmonary:     Effort: Pulmonary effort is normal.     Breath sounds: Normal breath sounds.  Abdominal:     General: Abdomen is flat. Bowel sounds are normal.     Palpations: Abdomen is soft.  Musculoskeletal:        General: Normal range of motion.     Cervical back: Normal range of motion.  Skin:    General: Skin is warm and dry.  Neurological:     General: No focal deficit present.     Mental Status: She is alert and oriented to person, place, and time.  Psychiatric:        Mood and Affect: Mood normal.        Behavior: Behavior normal.        Thought Content: Thought content normal.        Judgment: Judgment normal.    Flowsheet Row Office Visit from 10/01/2023 in [facility]  Thoughts that you would be better off dead, or of hurting yourself in some way Not at all  PHQ-9 Total Score 4         10/01/2023   10:09 AM  GAD 7 : Generalized Anxiety Score  Nervous, Anxious, on Edge 0  Control/stop worrying 1  Worry too much - different things 1  Trouble relaxing 0  Restless 0  Easily annoyed or irritable 1  Afraid - awful might happen 0  Total GAD 7 Score 3        Assessment & Plan:  Return for fasting lab work.   Problem List Items Addressed This Visit       Other   Encounter to establish care - Primary   Hair loss   Relevant Orders   CBC with Differential/Platelet   Vitamin D (25 hydroxy)   Vitamin B12   Other Visit Diagnoses       Diabetes mellitus screening       Relevant Orders   Hemoglobin A1c   CMP14+EGFR     Thyroid disorder  screening       Relevant Orders   TSH     Lipid screening       Relevant Orders   Lipid Profile   CMP14+EGFR       Return in about 4 weeks (  around 10/29/2023).   Total time spent: 25 minutes  Google, NP  10/01/2023   This document may have been prepared by Dragon Voice Recognition software and as such may include unintentional dictation errors.

## 2023-10-02 ENCOUNTER — Other Ambulatory Visit: Payer: Medicaid Other

## 2023-10-06 LAB — CBC WITH DIFFERENTIAL/PLATELET
Basophils Absolute: 0 10*3/uL (ref 0.0–0.2)
Basos: 0 %
EOS (ABSOLUTE): 0.2 10*3/uL (ref 0.0–0.4)
Eos: 3 %
Hematocrit: 36.8 % (ref 34.0–46.6)
Hemoglobin: 12.2 g/dL (ref 11.1–15.9)
Immature Grans (Abs): 0 10*3/uL (ref 0.0–0.1)
Immature Granulocytes: 0 %
Lymphocytes Absolute: 2.6 10*3/uL (ref 0.7–3.1)
Lymphs: 37 %
MCH: 29 pg (ref 26.6–33.0)
MCHC: 33.2 g/dL (ref 31.5–35.7)
MCV: 87 fL (ref 79–97)
Monocytes Absolute: 0.3 10*3/uL (ref 0.1–0.9)
Monocytes: 5 %
Neutrophils Absolute: 4 10*3/uL (ref 1.4–7.0)
Neutrophils: 55 %
Platelets: 332 10*3/uL (ref 150–450)
RBC: 4.21 x10E6/uL (ref 3.77–5.28)
RDW: 12.6 % (ref 11.7–15.4)
WBC: 7.2 10*3/uL (ref 3.4–10.8)

## 2023-10-06 LAB — LIPID PANEL
Chol/HDL Ratio: 4.7 {ratio} — ABNORMAL HIGH (ref 0.0–4.4)
Cholesterol, Total: 169 mg/dL (ref 100–199)
HDL: 36 mg/dL — ABNORMAL LOW (ref >39)
LDL Chol Calc (NIH): 114 mg/dL — ABNORMAL HIGH (ref 0–99)
Triglycerides: 104 mg/dL (ref 0–149)
VLDL Cholesterol Cal: 19 mg/dL (ref 5–40)

## 2023-10-06 LAB — CMP14+EGFR
ALT: 15 [IU]/L (ref 0–32)
AST: 19 [IU]/L (ref 0–40)
Albumin: 4.3 g/dL (ref 3.9–4.9)
Alkaline Phosphatase: 76 [IU]/L (ref 44–121)
BUN/Creatinine Ratio: 12 (ref 9–23)
BUN: 8 mg/dL (ref 6–20)
Bilirubin Total: 0.3 mg/dL (ref 0.0–1.2)
Calcium: 9.1 mg/dL (ref 8.7–10.2)
Chloride: 107 mmol/L — ABNORMAL HIGH (ref 96–106)
Creatinine, Ser: 0.67 mg/dL (ref 0.57–1.00)
Globulin, Total: 2.8 g/dL (ref 1.5–4.5)
Glucose: 97 mg/dL (ref 70–99)
Potassium: 4 mmol/L (ref 3.5–5.2)
Sodium: 138 mmol/L (ref 134–144)
Total Protein: 7.1 g/dL (ref 6.0–8.5)
eGFR: 116 mL/min/{1.73_m2} (ref >59)

## 2023-10-06 LAB — HEMOGLOBIN A1C
Est. average glucose Bld gHb Est-mCnc: 117 mg/dL
Hgb A1c MFr Bld: 5.7 % — ABNORMAL HIGH (ref 4.8–5.6)

## 2023-10-06 LAB — TSH: TSH: 2.64 u[IU]/mL (ref 0.450–4.500)

## 2023-10-06 LAB — VITAMIN B12

## 2023-10-06 LAB — VITAMIN D 25 HYDROXY (VIT D DEFICIENCY, FRACTURES): Vit D, 25-Hydroxy: 28.5 ng/mL — ABNORMAL LOW (ref 30.0–100.0)

## 2023-10-29 ENCOUNTER — Ambulatory Visit: Payer: Medicaid Other | Admitting: Cardiology

## 2023-10-29 ENCOUNTER — Encounter: Payer: Self-pay | Admitting: Cardiology

## 2023-10-29 VITALS — BP 132/80 | HR 84 | Ht 65.0 in | Wt 195.0 lb

## 2023-10-29 DIAGNOSIS — L659 Nonscarring hair loss, unspecified: Secondary | ICD-10-CM

## 2023-10-29 DIAGNOSIS — R7303 Prediabetes: Secondary | ICD-10-CM | POA: Insufficient documentation

## 2023-10-29 DIAGNOSIS — E559 Vitamin D deficiency, unspecified: Secondary | ICD-10-CM | POA: Insufficient documentation

## 2023-10-29 DIAGNOSIS — Z0130 Encounter for examination of blood pressure without abnormal findings: Secondary | ICD-10-CM

## 2023-10-29 NOTE — Progress Notes (Signed)
 Established Patient Office Visit  Subjective:  Patient ID: Carol Juarez, female    DOB: Dec 07, 1985  Age: 38 y.o. MRN: [id number]  Chief Complaint  Patient presents with   Follow-up    4 weeks follow up    Patient in office for 4 week follow up. Patient reports feeling well. Continues to have hair loss, will refer to dermatology.  Patient states she has been going to bed later, sleeping through the night now. Continues to sleep with TV on. Discussed recent lab work. Hgb A1c elevated, pre diabetic. Patient made dietary changes after previous visit, will continue. Vitamin D mildly low, recommend OTC vitamin D supplement.  LDL elevated, reduce fat intake.     No other concerns at this time.   Past Medical History:  Diagnosis Date   Sebaceoma     Past Surgical History:  Procedure Laterality Date   CESAREAN SECTION      Social History   Socioeconomic History   Marital status: Single    Spouse name: Not on file   Number of children: Not on file   Years of education: Not on file   Highest education level: Not on file  Occupational History   Not on file  Tobacco Use   Smoking status: Never   Smokeless tobacco: Never  Substance and Sexual Activity   Alcohol use: Never   Drug use: Never   Sexual activity: Yes    Birth control/protection: None  Other Topics Concern   Not on file  Social History Narrative   Not on file   Social Drivers of Health   Financial Resource Strain: Not on file  Food Insecurity: Not on file  Transportation Needs: Not on file  Physical Activity: Not on file  Stress: Not on file  Social Connections: Not on file  Intimate Partner Violence: Not on file    Family History  Problem Relation Age of Onset   Other Cousin        unk skin condition; passed from it    No Known Allergies  No outpatient medications prior to visit.   No facility-administered medications prior to visit.    Review of Systems  Constitutional: Negative.    HENT: Negative.    Eyes: Negative.   Respiratory: Negative.  Negative for shortness of breath.   Cardiovascular: Negative.  Negative for chest pain.  Gastrointestinal: Negative.  Negative for abdominal pain, constipation and diarrhea.  Genitourinary: Negative.   Musculoskeletal:  Negative for joint pain and myalgias.  Skin: Negative.   Neurological: Negative.  Negative for dizziness and headaches.  Endo/Heme/Allergies: Negative.   All other systems reviewed and are negative.      Objective:   BP 132/80   Pulse 84   Ht 5\' 5"  (1.651 m)   Wt 195 lb (88.5 kg)   SpO2 99%   BMI 32.45 kg/m   Vitals:   10/29/23 0952  BP: 132/80  Pulse: 84  Height: 5\' 5"  (1.651 m)  Weight: 195 lb (88.5 kg)  SpO2: 99%  BMI (Calculated): 32.45    Physical Exam Vitals and nursing note reviewed.  Constitutional:      Appearance: Normal appearance. She is normal weight.  HENT:     Head: Normocephalic and atraumatic.     Nose: Nose normal.     Mouth/Throat:     Mouth: Mucous membranes are moist.  Eyes:     Extraocular Movements: Extraocular movements intact.     Conjunctiva/sclera: Conjunctivae normal.  Pupils: Pupils are equal, round, and reactive to light.  Cardiovascular:     Rate and Rhythm: Normal rate and regular rhythm.     Pulses: Normal pulses.     Heart sounds: Normal heart sounds.  Pulmonary:     Effort: Pulmonary effort is normal.     Breath sounds: Normal breath sounds.  Abdominal:     General: Abdomen is flat. Bowel sounds are normal.     Palpations: Abdomen is soft.  Musculoskeletal:        General: Normal range of motion.     Cervical back: Normal range of motion.  Skin:    General: Skin is warm and dry.  Neurological:     General: No focal deficit present.     Mental Status: She is alert and oriented to person, place, and time.  Psychiatric:        Mood and Affect: Mood normal.        Behavior: Behavior normal.        Thought Content: Thought content normal.         Judgment: Judgment normal.      No results found for any visits on 10/29/23.  Recent Results (from the past 2160 hours)  Hemoglobin A1c     Status: Abnormal   Collection Time: 10/02/23  9:28 AM  Result Value Ref Range   Hgb A1c MFr Bld 5.7 (H) 4.8 - 5.6 %    Comment:          Prediabetes: 5.7 - 6.4          Diabetes: >6.4          Glycemic control for adults with diabetes: <7.0    Est. average glucose Bld gHb Est-mCnc 117 mg/dL  Lipid Profile     Status: Abnormal   Collection Time: 10/02/23  9:28 AM  Result Value Ref Range   Cholesterol, Total 169 100 - 199 mg/dL   Triglycerides 161 0 - 149 mg/dL   HDL 36 (L) >09 mg/dL   VLDL Cholesterol Cal 19 5 - 40 mg/dL   LDL Chol Calc (NIH) 604 (H) 0 - 99 mg/dL   Chol/HDL Ratio 4.7 (H) 0.0 - 4.4 ratio    Comment:                                   T. Chol/HDL Ratio                                             Men  Women                               1/2 Avg.Risk  3.4    3.3                                   Avg.Risk  5.0    4.4                                2X Avg.Risk  9.6    7.1  3X Avg.Risk 23.4   11.0   TSH     Status: None   Collection Time: 10/02/23  9:28 AM  Result Value Ref Range   TSH 2.640 0.450 - 4.500 uIU/mL  CBC with Differential/Platelet     Status: None   Collection Time: 10/02/23  9:28 AM  Result Value Ref Range   WBC 7.2 3.4 - 10.8 x10E3/uL   RBC 4.21 3.77 - 5.28 x10E6/uL   Hemoglobin 12.2 11.1 - 15.9 g/dL   Hematocrit 16.1 09.6 - 46.6 %   MCV 87 79 - 97 fL   MCH 29.0 26.6 - 33.0 pg   MCHC 33.2 31.5 - 35.7 g/dL   RDW 04.5 40.9 - 81.1 %   Platelets 332 150 - 450 x10E3/uL   Neutrophils 55 Not Estab. %   Lymphs 37 Not Estab. %   Monocytes 5 Not Estab. %   Eos 3 Not Estab. %   Basos 0 Not Estab. %   Neutrophils Absolute 4.0 1.4 - 7.0 x10E3/uL   Lymphocytes Absolute 2.6 0.7 - 3.1 x10E3/uL   Monocytes Absolute 0.3 0.1 - 0.9 x10E3/uL   EOS (ABSOLUTE) 0.2 0.0 - 0.4 x10E3/uL    Basophils Absolute 0.0 0.0 - 0.2 x10E3/uL   Immature Granulocytes 0 Not Estab. %   Immature Grans (Abs) 0.0 0.0 - 0.1 x10E3/uL  CMP14+EGFR     Status: Abnormal   Collection Time: 10/02/23  9:28 AM  Result Value Ref Range   Glucose 97 70 - 99 mg/dL   BUN 8 6 - 20 mg/dL   Creatinine, Ser 9.14 0.57 - 1.00 mg/dL   eGFR 782 >95 AO/ZHY/8.65   BUN/Creatinine Ratio 12 9 - 23   Sodium 138 134 - 144 mmol/L   Potassium 4.0 3.5 - 5.2 mmol/L   Chloride 107 (H) 96 - 106 mmol/L   CO2 CANCELED mmol/L    Comment: [facility] was unable to collect sufficient specimen to perform the following test(s), and is providing the patient with re-collection instructions.  Result canceled by the ancillary.    Calcium 9.1 8.7 - 10.2 mg/dL   Total Protein 7.1 6.0 - 8.5 g/dL   Albumin 4.3 3.9 - 4.9 g/dL   Globulin, Total 2.8 1.5 - 4.5 g/dL   Bilirubin Total 0.3 0.0 - 1.2 mg/dL   Alkaline Phosphatase 76 44 - 121 IU/L   AST 19 0 - 40 IU/L   ALT 15 0 - 32 IU/L  Vitamin D (25 hydroxy)     Status: Abnormal   Collection Time: 10/02/23  9:28 AM  Result Value Ref Range   Vit D, 25-Hydroxy 28.5 (L) 30.0 - 100.0 ng/mL    Comment: Vitamin D deficiency has been defined by the Institute of Medicine and an Endocrine Society practice guideline as a level of serum 25-OH vitamin D less than 20 ng/mL (1,2). The Endocrine Society went on to further define vitamin D insufficiency as a level between 21 and 29 ng/mL (2). 1. IOM (Institute of Medicine). 2010. Dietary reference    intakes for calcium and D. [place]: The    [employer]. 2. Holick MF, Binkley Maybrook, Bischoff-Ferrari HA, et al.    Evaluation, treatment, and prevention of vitamin D    deficiency: an Endocrine Society clinical practice    guideline. JCEM. 2011 Jul; 96(7):1911-30.   Vitamin B12     Status: None   Collection Time: 10/02/23  9:28 AM  Result Value Ref Range   Vitamin B-12 CANCELED pg/mL  Comment: [facility] was unable to collect  sufficient specimen to perform the following test(s), and is providing the patient with re-collection instructions.  Result canceled by the ancillary.       Assessment & Plan:  Referral to dermatology for hair loss.  Continue to make dietary changes to lower LDL and Hgb A1c.  Problem List Items Addressed This Visit       Other   Hair loss   Relevant Orders   Ambulatory referral to Dermatology   Prediabetes - Primary   Vitamin D deficiency    Return in about 4 months (around 02/28/2024) for with fasting labs prior.   Total time spent: 25 minutes  Google, NP  10/29/2023   This document may have been prepared by Dragon Voice Recognition software and as such may include unintentional dictation errors.

## 2024-03-03 ENCOUNTER — Ambulatory Visit: Admitting: Cardiology

## 2024-03-03 ENCOUNTER — Encounter: Payer: Self-pay | Admitting: Cardiology

## 2024-03-03 VITALS — BP 128/85 | HR 83 | Ht 65.0 in | Wt 194.2 lb

## 2024-03-03 DIAGNOSIS — Z13220 Encounter for screening for lipoid disorders: Secondary | ICD-10-CM | POA: Diagnosis not present

## 2024-03-03 DIAGNOSIS — E559 Vitamin D deficiency, unspecified: Secondary | ICD-10-CM

## 2024-03-03 DIAGNOSIS — Z0130 Encounter for examination of blood pressure without abnormal findings: Secondary | ICD-10-CM

## 2024-03-03 DIAGNOSIS — Z1329 Encounter for screening for other suspected endocrine disorder: Secondary | ICD-10-CM

## 2024-03-03 DIAGNOSIS — R7303 Prediabetes: Secondary | ICD-10-CM

## 2024-03-03 NOTE — Progress Notes (Signed)
 Established Patient Office Visit  Subjective:  Patient ID: Carol Juarez, female    DOB: [birth year]  Age: 38 y.o. MRN: [id number]  Chief Complaint  Patient presents with   Follow-up    Lab results    Patient in office for 4 month follow up, discuss lab results.  Patient doing well, no complaints today.  Did not have blood work done, will get fasting labs today. Will call with results.  Patient reports pap smear was negative in 2023.     No other concerns at this time.   Past Medical History:  Diagnosis Date   Sebaceoma     Past Surgical History:  Procedure Laterality Date   CESAREAN SECTION      Social History   Socioeconomic History   Marital status: Single    Spouse name: Not on file   Number of children: Not on file   Years of education: Not on file   Highest education level: Not on file  Occupational History   Not on file  Tobacco Use   Smoking status: Never   Smokeless tobacco: Never  Substance and Sexual Activity   Alcohol use: Never   Drug use: Never   Sexual activity: Yes    Birth control/protection: None  Other Topics Concern   Not on file  Social History Narrative   Not on file   Social Drivers of Health   Financial Resource Strain: Not on file  Food Insecurity: Not on file  Transportation Needs: Not on file  Physical Activity: Not on file  Stress: Not on file  Social Connections: Not on file  Intimate Partner Violence: Not on file    Family History  Problem Relation Age of Onset   Other Cousin        unk skin condition; passed from it    No Known Allergies  No outpatient medications prior to visit.   No facility-administered medications prior to visit.    Review of Systems  Constitutional: Negative.   HENT: Negative.    Eyes: Negative.   Respiratory: Negative.  Negative for shortness of breath.   Cardiovascular: Negative.  Negative for chest pain.  Gastrointestinal: Negative.  Negative for abdominal pain, constipation  and diarrhea.  Genitourinary: Negative.   Musculoskeletal:  Negative for joint pain and myalgias.  Skin: Negative.   Neurological: Negative.  Negative for dizziness and headaches.  Endo/Heme/Allergies: Negative.   All other systems reviewed and are negative.      Objective:   BP 128/85   Pulse 83   Ht 5' 5 (1.651 m)   Wt 194 lb 3.2 oz (88.1 kg)   SpO2 94%   BMI 32.32 kg/m   Vitals:   03/03/24 0921  BP: 128/85  Pulse: 83  Height: 5' 5 (1.651 m)  Weight: 194 lb 3.2 oz (88.1 kg)  SpO2: 94%  BMI (Calculated): 32.32    Physical Exam Vitals and nursing note reviewed.  Constitutional:      Appearance: Normal appearance. She is normal weight.  HENT:     Head: Normocephalic and atraumatic.     Nose: Nose normal.     Mouth/Throat:     Mouth: Mucous membranes are moist.  Eyes:     Extraocular Movements: Extraocular movements intact.     Conjunctiva/sclera: Conjunctivae normal.     Pupils: Pupils are equal, round, and reactive to light.  Cardiovascular:     Rate and Rhythm: Normal rate and regular rhythm.     Pulses: Normal pulses.  Heart sounds: Normal heart sounds.  Pulmonary:     Effort: Pulmonary effort is normal.     Breath sounds: Normal breath sounds.  Abdominal:     General: Abdomen is flat. Bowel sounds are normal.     Palpations: Abdomen is soft.  Musculoskeletal:        General: Normal range of motion.     Cervical back: Normal range of motion.  Skin:    General: Skin is warm and dry.  Neurological:     General: No focal deficit present.     Mental Status: She is alert and oriented to person, place, and time.  Psychiatric:        Mood and Affect: Mood normal.        Behavior: Behavior normal.        Thought Content: Thought content normal.        Judgment: Judgment normal.      No results found for any visits on 03/03/24.  No results found for this or any previous visit (from the past 2160 hours).    Assessment & Plan:  Fasting lab work  today. Will call with results.   Problem List Items Addressed This Visit       Other   Prediabetes - Primary   Relevant Orders   CMP14+EGFR   Hemoglobin A1c   Vitamin D  deficiency   Relevant Orders   Vitamin D  (25 hydroxy)   Other Visit Diagnoses       Thyroid disorder screening       Relevant Orders   TSH     Lipid screening       Relevant Orders   Lipid Profile       Return in about 4 months (around 07/04/2024) for fasting labs prior.   Total time spent: 25 minutes  Google, NP  03/03/2024   This document may have been prepared by Dragon Voice Recognition software and as such may include unintentional dictation errors.

## 2024-03-04 LAB — CMP14+EGFR
ALT: 12 IU/L (ref 0–32)
AST: 16 IU/L (ref 0–40)
Albumin: 3.9 g/dL (ref 3.9–4.9)
Alkaline Phosphatase: 74 IU/L (ref 44–121)
BUN/Creatinine Ratio: 14 (ref 9–23)
BUN: 10 mg/dL (ref 6–20)
Bilirubin Total: <0.2 mg/dL (ref 0.0–1.2)
CO2: 16 mmol/L — ABNORMAL LOW (ref 20–29)
Calcium: 9.3 mg/dL (ref 8.7–10.2)
Chloride: 108 mmol/L — ABNORMAL HIGH (ref 96–106)
Creatinine, Ser: 0.73 mg/dL (ref 0.57–1.00)
Globulin, Total: 3.2 g/dL (ref 1.5–4.5)
Glucose: 98 mg/dL (ref 70–99)
Potassium: 4.5 mmol/L (ref 3.5–5.2)
Sodium: 138 mmol/L (ref 134–144)
Total Protein: 7.1 g/dL (ref 6.0–8.5)
eGFR: 109 mL/min/1.73 (ref >59)

## 2024-03-04 LAB — LIPID PANEL
Chol/HDL Ratio: 4.1 ratio (ref 0.0–4.4)
Cholesterol, Total: 182 mg/dL (ref 100–199)
HDL: 44 mg/dL (ref >39)
LDL Chol Calc (NIH): 126 mg/dL — ABNORMAL HIGH (ref 0–99)
Triglycerides: 66 mg/dL (ref 0–149)
VLDL Cholesterol Cal: 12 mg/dL (ref 5–40)

## 2024-03-04 LAB — HEMOGLOBIN A1C
Est. average glucose Bld gHb Est-mCnc: 117 mg/dL
Hgb A1c MFr Bld: 5.7 % — ABNORMAL HIGH (ref 4.8–5.6)

## 2024-03-04 LAB — TSH: TSH: 2.04 u[IU]/mL (ref 0.450–4.500)

## 2024-03-04 LAB — VITAMIN D 25 HYDROXY (VIT D DEFICIENCY, FRACTURES): Vit D, 25-Hydroxy: 32.6 ng/mL (ref 30.0–100.0)

## 2024-03-14 ENCOUNTER — Ambulatory Visit: Payer: Self-pay | Admitting: Cardiology

## 2024-03-15 NOTE — Progress Notes (Signed)
Pt informed

## 2024-07-04 ENCOUNTER — Ambulatory Visit: Admitting: Cardiology

## 2024-07-04 ENCOUNTER — Encounter: Payer: Self-pay | Admitting: Cardiology

## 2024-07-04 VITALS — BP 128/74 | HR 65 | Ht 65.0 in | Wt 194.0 lb

## 2024-07-04 DIAGNOSIS — E6609 Other obesity due to excess calories: Secondary | ICD-10-CM

## 2024-07-04 DIAGNOSIS — Z0130 Encounter for examination of blood pressure without abnormal findings: Secondary | ICD-10-CM

## 2024-07-04 DIAGNOSIS — E559 Vitamin D deficiency, unspecified: Secondary | ICD-10-CM | POA: Diagnosis not present

## 2024-07-04 DIAGNOSIS — E7800 Pure hypercholesterolemia, unspecified: Secondary | ICD-10-CM | POA: Insufficient documentation

## 2024-07-04 DIAGNOSIS — R7303 Prediabetes: Secondary | ICD-10-CM | POA: Diagnosis not present

## 2024-07-04 DIAGNOSIS — E66811 Obesity, class 1: Secondary | ICD-10-CM | POA: Diagnosis not present

## 2024-07-04 DIAGNOSIS — Z6832 Body mass index (BMI) 32.0-32.9, adult: Secondary | ICD-10-CM

## 2024-07-04 NOTE — Progress Notes (Signed)
 Established Patient Office Visit  Subjective:  Patient ID: Carol Juarez, female    DOB: 03/12/1986  Age: 38 y.o. MRN: [id number]  Chief Complaint  Patient presents with   Follow-up    4 months follow up    Patient in office for 4 month follow up, discuss recent lab results. Patient doing well, no complaints today.  Patient reports pap smear was negative in 2023.  Discussed recent lab results. Vitamin D  normal, patient reports taking daily multivitamin.  Hgb A1c stable. Recommend decreasing sugar and starchy foods. LDL elevated. Patient given mediterranean diet handout at previous visit.   Patient reports pap smear was negative in 2023.  No changes today.     No other concerns at this time.   Past Medical History:  Diagnosis Date   Sebaceoma     Past Surgical History:  Procedure Laterality Date   CESAREAN SECTION      Social History   Socioeconomic History   Marital status: Single    Spouse name: Not on file   Number of children: Not on file   Years of education: Not on file   Highest education level: Not on file  Occupational History   Not on file  Tobacco Use   Smoking status: Never   Smokeless tobacco: Never  Substance and Sexual Activity   Alcohol use: Never   Drug use: Never   Sexual activity: Yes    Birth control/protection: None  Other Topics Concern   Not on file  Social History Narrative   Not on file   Social Drivers of Health   Financial Resource Strain: Not on file  Food Insecurity: Not on file  Transportation Needs: Not on file  Physical Activity: Not on file  Stress: Not on file  Social Connections: Not on file  Intimate Partner Violence: Not on file    Family History  Problem Relation Age of Onset   Other Cousin        unk skin condition; passed from it    No Known Allergies  No outpatient medications prior to visit.   No facility-administered medications prior to visit.    Review of Systems  Constitutional: Negative.    HENT: Negative.    Eyes: Negative.   Respiratory: Negative.  Negative for shortness of breath.   Cardiovascular: Negative.  Negative for chest pain.  Gastrointestinal: Negative.  Negative for abdominal pain, constipation and diarrhea.  Genitourinary: Negative.   Musculoskeletal:  Negative for joint pain and myalgias.  Skin: Negative.   Neurological: Negative.  Negative for dizziness and headaches.  Endo/Heme/Allergies: Negative.   All other systems reviewed and are negative.      Objective:   BP 128/74   Pulse 65   Ht 5' 5 (1.651 m)   Wt 194 lb (88 kg)   SpO2 98%   BMI 32.28 kg/m   Vitals:   07/04/24 1128  BP: 128/74  Pulse: 65  Height: 5' 5 (1.651 m)  Weight: 194 lb (88 kg)  SpO2: 98%  BMI (Calculated): 32.28    Physical Exam Vitals and nursing note reviewed.  Constitutional:      Appearance: Normal appearance. She is normal weight.  HENT:     Head: Normocephalic and atraumatic.     Nose: Nose normal.     Mouth/Throat:     Mouth: Mucous membranes are moist.  Eyes:     Extraocular Movements: Extraocular movements intact.     Conjunctiva/sclera: Conjunctivae normal.     Pupils:  Pupils are equal, round, and reactive to light.  Cardiovascular:     Rate and Rhythm: Normal rate and regular rhythm.     Pulses: Normal pulses.     Heart sounds: Normal heart sounds.  Pulmonary:     Effort: Pulmonary effort is normal.     Breath sounds: Normal breath sounds.  Abdominal:     General: Abdomen is flat. Bowel sounds are normal.     Palpations: Abdomen is soft.  Musculoskeletal:        General: Normal range of motion.     Cervical back: Normal range of motion.  Skin:    General: Skin is warm and dry.  Neurological:     General: No focal deficit present.     Mental Status: She is alert and oriented to person, place, and time.  Psychiatric:        Mood and Affect: Mood normal.        Behavior: Behavior normal.        Thought Content: Thought content normal.         Judgment: Judgment normal.      No results found for any visits on 07/04/24.  No results found for this or any previous visit (from the past 2160 hours).    Assessment & Plan:  No changes today. Follow mediterranean diet to lower LDL and Hgb A1c.   Problem List Items Addressed This Visit       Other   Prediabetes - Primary   Vitamin D  deficiency   Class 1 obesity due to excess calories without serious comorbidity with body mass index (BMI) of 32.0 to 32.9 in adult   Elevated LDL cholesterol level    Return in about 4 months (around 11/01/2024) for fasitng lab work prior.   Total time spent: 25 minutes. This time includes review of previous notes and results and patient face to face interaction during today's visit.    Jeoffrey Pollen, NP  07/04/2024   This document may have been prepared by Dragon Voice Recognition software and as such may include unintentional dictation errors.

## 2024-07-08 ENCOUNTER — Ambulatory Visit: Admitting: Cardiology

## 2024-08-02 ENCOUNTER — Encounter: Payer: Self-pay | Admitting: Dermatology

## 2024-08-02 ENCOUNTER — Ambulatory Visit: Admitting: Dermatology

## 2024-08-02 VITALS — BP 133/83

## 2024-08-02 DIAGNOSIS — L658 Other specified nonscarring hair loss: Secondary | ICD-10-CM | POA: Diagnosis not present

## 2024-08-02 DIAGNOSIS — D485 Neoplasm of uncertain behavior of skin: Secondary | ICD-10-CM

## 2024-08-02 DIAGNOSIS — L659 Nonscarring hair loss, unspecified: Secondary | ICD-10-CM

## 2024-08-02 DIAGNOSIS — L6681 Central centrifugal cicatricial alopecia: Secondary | ICD-10-CM

## 2024-08-02 MED ORDER — SAFETY SEAL MISCELLANEOUS MISC: 11 refills | Status: DC

## 2024-08-02 NOTE — Progress Notes (Signed)
 "  New Patient Visit   Subjective  Carol Juarez is a 38 y.o. female who presents for a NEW PATIENT appointment to be examined for the concerns as listed below.  Patient (and/or pt guardian) consented to the use of AI-assisted tools for note generation.  Hair Loss: Pt stated that she has previously lost hair towards the back of the scalp about 6 years ago that has since regrown. However, when her referral was sent over to dermatology in March 2025 she was dealing with another flare of hair loss which she is seeing regrowth. Pt is concerned about the frontal hairline is thinning as well. Pt has not have Rx for this issue but she previously been seen by derm in [place]  who administered injections twice a week. However pt stated she only had Tx for 2 weeks as the injections were too painful. She has tried an OTC shampoo with Batana oil. Pt stated the scalp is not itchy or tender to touch. No family Hx of hair loss.    Are you nursing, pregnant or trying to conceive? No   The following portions of the chart were reviewed this encounter and updated as appropriate: medications, allergies, medical history  Review of Systems:  No other skin or systemic complaints except as noted in HPI or Assessment and Plan.  Objective  Well appearing patient in no apparent distress; mood and affect are within normal limits.   A focused examination was performed of the following areas: scalp   Relevant exam findings are noted in the Assessment and Plan.                 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 Differential diagnosis includes frontal fibrosing alopecia, traction alopecia, and androgenetic alopecia. Frontal fibrosing alopecia is suspected due to the pattern of hair loss and potential scarring. Traction alopecia and androgenetic alopecia are  considered due to hair loss patterns and age. Biopsy is necessary to confirm diagnosis and guide treatment. Early intervention is crucial to prevent permanent scarring and hair loss.   - Performed biopsy of the affected area to confirm diagnosis. - Prescribed minoxidil and clobetasol from [facility] pharmacy to reduce inflammation and promote hair growth. - Scheduled follow-up in three weeks for suture removal and to discuss biopsy results.  NEOPLASM OF UNCERTAIN BEHAVIOR OF SKIN Mid Frontal Scalp - Skin / nail biopsy Type of biopsy: punch   Informed consent: discussed and consent obtained   Timeout: patient name, date of birth, surgical site, and procedure verified   Procedure prep:  Patient was prepped and draped in usual sterile fashion (the patient was cleaned and prepped) Prep type:  Isopropyl alcohol Anesthesia: the lesion was anesthetized in a standard fashion   Anesthetic:  1% lidocaine  w/ epinephrine 1-100,000 buffered w/ 8.4% NaHCO3 Punch size:  3 mm Suture size:  3-0 Suture type: Prolene (polypropylene)   Hemostasis achieved with: suture, pressure and aluminum chloride   Outcome: patient tolerated procedure well   Post-procedure details: sterile dressing applied and wound care instructions given   Dressing type: bandage, petrolatum and pressure dressing    Specimen 1 - Surgical pathology Differential Diagnosis: frontal fibrosing alopecia v traction alopecia v androgenetic alopecia   Check Margins: No  Return in about 2 weeks (around 08/16/2024) for suture removal & results.   Documentation: I have reviewed the above documentation for accuracy  and completeness, and I agree with the above.  I, Shirron Maranda, CMA II, am acting as scribe for:   Delon Lenis, DO     "

## 2024-08-02 NOTE — Patient Instructions (Addendum)

## 2024-08-03 LAB — SURGICAL PATHOLOGY

## 2024-08-22 ENCOUNTER — Ambulatory Visit: Payer: Self-pay | Admitting: Dermatology

## 2024-08-23 ENCOUNTER — Encounter: Payer: Self-pay | Admitting: Dermatology

## 2024-08-23 ENCOUNTER — Ambulatory Visit: Admitting: Dermatology

## 2024-08-23 VITALS — BP 121/84

## 2024-08-23 DIAGNOSIS — L661 Lichen planopilaris, unspecified: Secondary | ICD-10-CM | POA: Diagnosis not present

## 2024-08-23 DIAGNOSIS — Z48817 Encounter for surgical aftercare following surgery on the skin and subcutaneous tissue: Secondary | ICD-10-CM | POA: Diagnosis not present

## 2024-08-23 NOTE — Progress Notes (Signed)
" ° °  Follow-Up Visit   Subjective  Carol Juarez is a 39 y.o. female established patient who presents for FOLLOW UP on the diagnoses listed below:  Patient was last evaluated on 08/02/24.   Suture Removal & Bx Results: Pt presents for suture removal and to discuss lichen planopilaris Dx. Pt stated that she has been applying Medrcok minoxidil 8% and clobetasol 0.05% QAM since last OV.    The following portions of the chart were reviewed this encounter and updated as appropriate: medications, allergies, medical history  Review of Systems:  No other skin or systemic complaints except as noted in HPI or Assessment and Plan.  Objective  Well appearing patient in no apparent distress; mood and affect are within normal limits.   A focused examination was performed of the following areas: scalp   Relevant exam findings are noted in the Assessment and Plan.    SURGICAL PATHOLOGY [facility] [address], [address] [place], [place] [address] Telephone [phone] or [phone] Fax [phone]  REPORT OF DERMATOPATHOLOGY   Accession #: [id number] Patient Name: Carol, Juarez Visit # : [id number]  MRN: [id number] Cytotechnologist: Ephriam Rolla Edelman, Dermatopathologist, Electronic Signature DOB/Age 02-16-1986 (Age: 75) Gender: F Collected Date: 08/02/2024 Received Date: 08/02/2024  FINAL DIAGNOSIS       1. 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 Differential diagnosis included CCCA, but clinical presentation aligns more with lichen  planopilaris. No itching or tenderness reported. Inflammation affects hair follicle stem cells, leading to scarring and hair loss. Treatment aims to halt further hair loss and promote regrowth.   - Prescribed clobetasol/minoxidil at last visit to stimulate hair growth, to be applied in the morning to avoid unwanted hair growth on the face. - Will add finasteride in May to address potential hormonal hair thinning at the temples. - Recommended collagen supplements (Vital Proteins brand) and Viviscal for hair, skin, and nail health. - Scheduled follow-up in May to assess progress and consider clobetasol injections if insufficient growth is observed. LICHEN PLANOPILARIS    Return in about 4 months (around 12/21/2024) for lichen planopilaris.   Documentation: I have reviewed the above documentation for accuracy and completeness, and I agree with the above.  I, Shirron Maranda, CMA II, am acting as scribe for:  Delon Lenis, DO "

## 2024-08-23 NOTE — Patient Instructions (Addendum)
 "  VISIT SUMMARY:  You came in today for a follow-up after your biopsy confirmed lichen planopilaris, a type of scarring alopecia. We discussed your current treatment and future plans to manage your condition.  YOUR PLAN:  -LICHEN PLANOPILARIS:  Lichen planopilaris is a condition that causes inflammation and scarring of the hair follicles, leading to hair loss. Your biopsy confirmed this diagnosis. You will continue using topical clobetasol to reduce inflammation and minoxidil to stimulate hair growth, applied once daily in the morning.   In May, we will add finasteride to address potential hormonal hair thinning at the temples.   Additionally, I recommend taking collagen supplements (Vital Proteins brand) and Viviscal for overall hair, skin, and nail health. We will have a follow-up appointment in May to assess your progress and consider clobetasol injections if needed.  INSTRUCTIONS:  Please continue using your topical clobetasol and minoxidil as directed. Start taking the recommended collagen supplements and Viviscal. We will meet again in May to evaluate your progress and discuss further treatment options, including the possible addition of finasteride and clobetasol injections if necessary.        Important Information  Due to recent changes in healthcare laws, you may see results of your pathology and/or laboratory studies on MyChart before the doctors have had a chance to review them. We understand that in some cases there may be results that are confusing or concerning to you. Please understand that not all results are received at the same time and often the doctors may need to interpret multiple results in order to provide you with the best plan of care or course of treatment. Therefore, we ask that you please give us  2 business days to thoroughly review all your results before contacting the office for clarification. Should we see a critical lab result, you will be contacted  sooner.   If You Need Anything After Your Visit  If you have any questions or concerns for your doctor, please call our main line at [phone] If no one answers, please leave a voicemail as directed and we will return your call as soon as possible. Messages left after 4 pm will be answered the following business day.   You may also send us  a message via MyChart. We typically respond to MyChart messages within 1-2 business days.  For prescription refills, please ask your pharmacy to contact our office. Our fax number is [phone].  If you have an urgent issue when the clinic is closed that cannot wait until the next business day, you can page your doctor at the number below.    Please note that while we do our best to be available for urgent issues outside of office hours, we are not available 24/7.   If you have an urgent issue and are unable to reach us , you may choose to seek medical care at your doctor's office, retail clinic, urgent care center, or emergency room.  If you have a medical emergency, please immediately call 911 or go to the emergency department. In the event of inclement weather, please call our main line at [phone] for an update on the status of any delays or closures.  Dermatology Medication Tips: Please keep the boxes that topical medications come in in order to help keep track of the instructions about where and how to use these. Pharmacies typically print the medication instructions only on the boxes and not directly on the medication tubes.   If your medication is too expensive, please contact our  office at [phone] or send us  a message through MyChart.   We are unable to tell what your co-pay for medications will be in advance as this is different depending on your insurance coverage. However, we may be able to find a substitute medication at lower cost or fill out paperwork to get insurance to cover a needed medication.   If a prior authorization is  required to get your medication covered by your insurance company, please allow us  1-2 business days to complete this process.  Drug prices often vary depending on where the prescription is filled and some pharmacies may offer cheaper prices.  The website www.goodrx.com contains coupons for medications through different pharmacies. The prices here do not account for what the cost may be with help from insurance (it may be cheaper with your insurance), but the website can give you the price if you did not use any insurance.  - You can print the associated coupon and take it with your prescription to the pharmacy.  - You may also stop by our office during regular business hours and pick up a GoodRx coupon card.  - If you need your prescription sent electronically to a different pharmacy, notify our office through [facility] or by phone at [phone]     "

## 2024-11-01 ENCOUNTER — Ambulatory Visit: Admitting: Cardiology

## 2024-12-22 ENCOUNTER — Ambulatory Visit: Admitting: Dermatology
